# Patient Record
Sex: Male | Born: 1970 | Race: White | Hispanic: No | Marital: Married | Smoking: Never smoker
Health system: Southern US, Community
[De-identification: ages and names within clinical notes are randomized; demographics above are authoritative.]

---

## 2008-02-09 ENCOUNTER — Encounter (INDEPENDENT_AMBULATORY_CARE_PROVIDER_SITE_OTHER): Payer: Self-pay | Admitting: Surgery

## 2008-02-09 ENCOUNTER — Ambulatory Visit (HOSPITAL_COMMUNITY): Admission: RE | Admit: 2008-02-09 | Discharge: 2008-02-09 | Payer: Self-pay | Admitting: Surgery

## 2008-10-03 IMAGING — CT CT ABD-PELV W/O CM
2 of 4 series · 14 of 42 positions shown, 19 images · non-contrast
Comparison: NONE

CLINICAL DATA: Bells, Emi Occasional epigastric pain, right 
lower  quadrant tenderness. Evaluate for appendicitis. 

CT ABDOMEN AND PELVIS WITHOUT INTRAVENOUS OR ORAL CONTRAST
TECHNIQUE: Multiple axial 3.0-mm-thick slices at 3.0-mm 
increments were obtained from the lung base through the pelvis.

[Series 2: wo · axial · 0.79mm/px · z∈[+67,+430]mm · 11 of 145 slices shown, 16 images]
[im 12/145  soft-tissue]
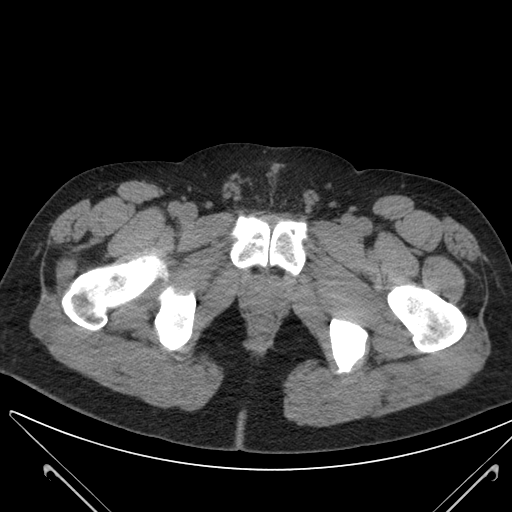
[im 12/145  bone]
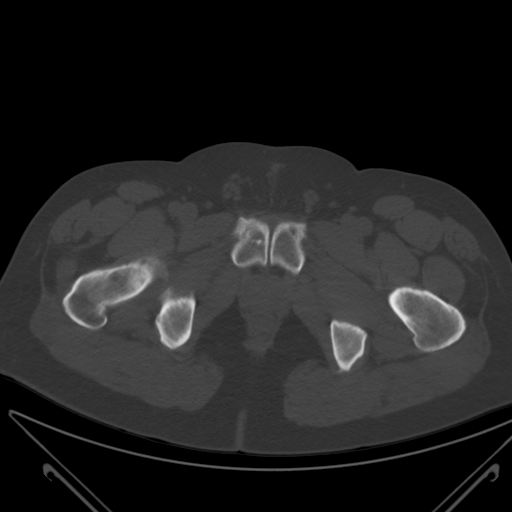
[im 23/145  soft-tissue]
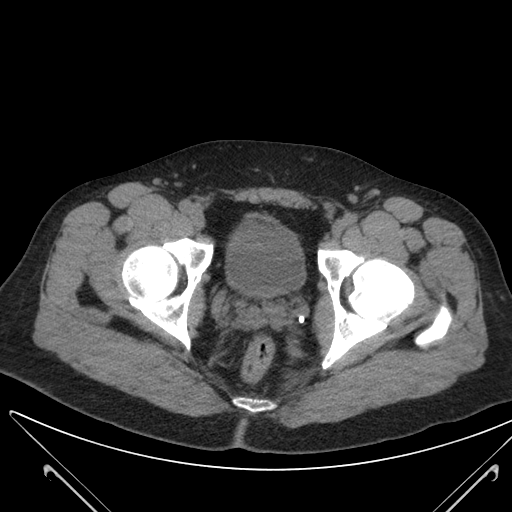
[im 45/145  soft-tissue]
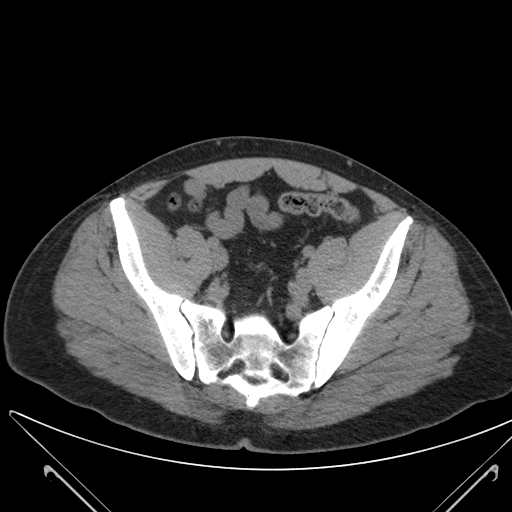
[im 56/145  soft-tissue]
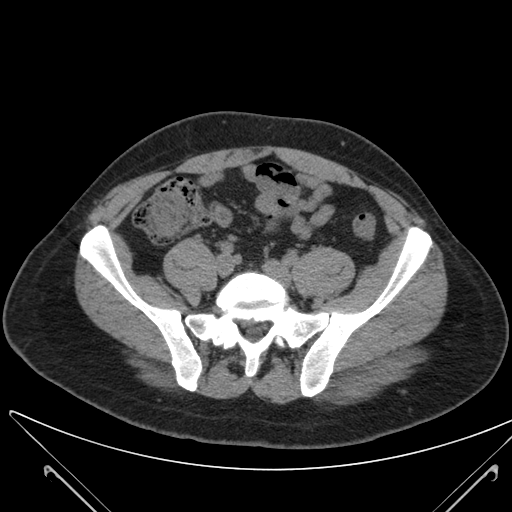
[im 67/145  soft-tissue]
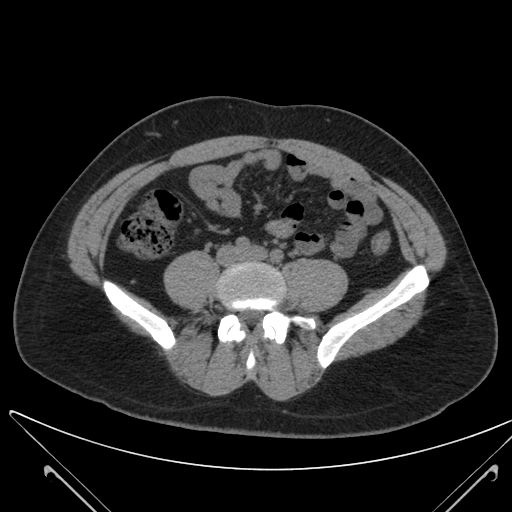
[im 78/145  soft-tissue]
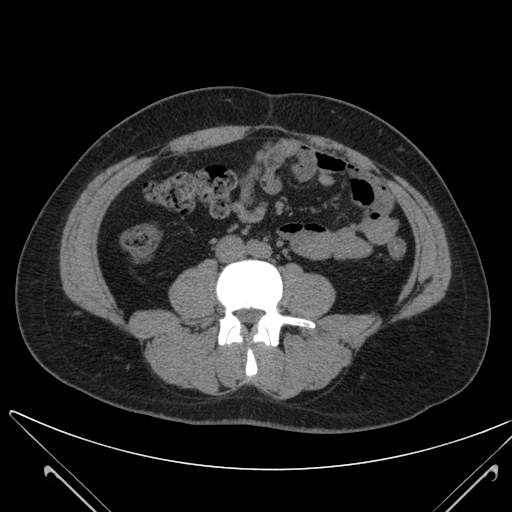
[im 89/145  soft-tissue]
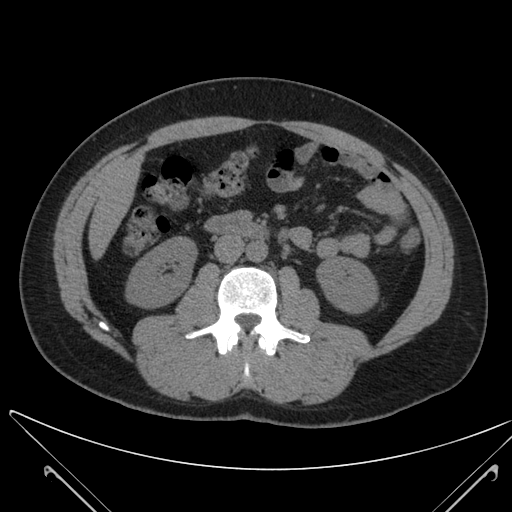
[im 100/145  lung]
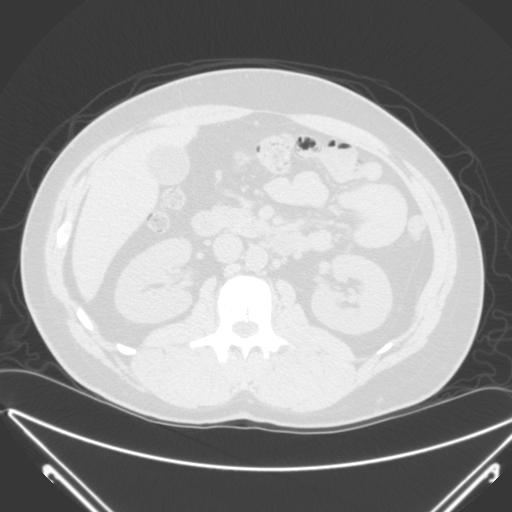
[im 111/145  soft-tissue]
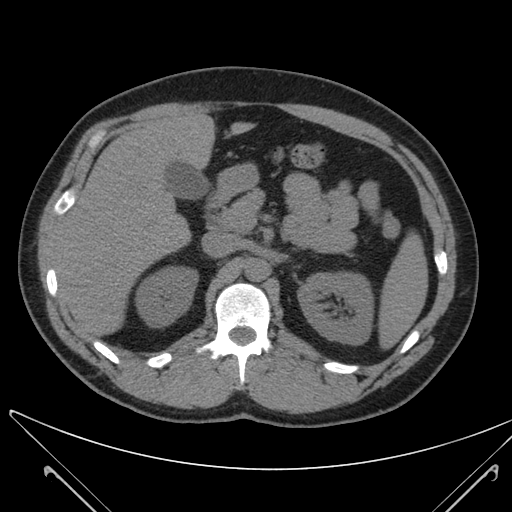
[im 111/145  lung]
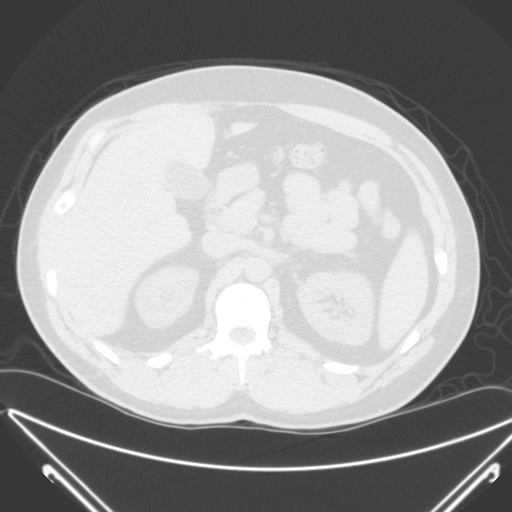
[im 122/145  soft-tissue]
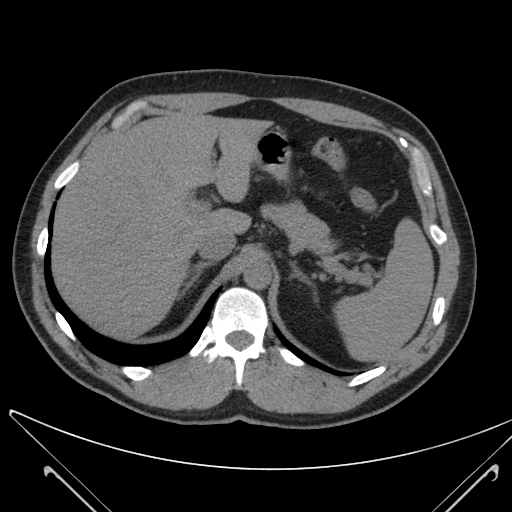
[im 122/145  lung]
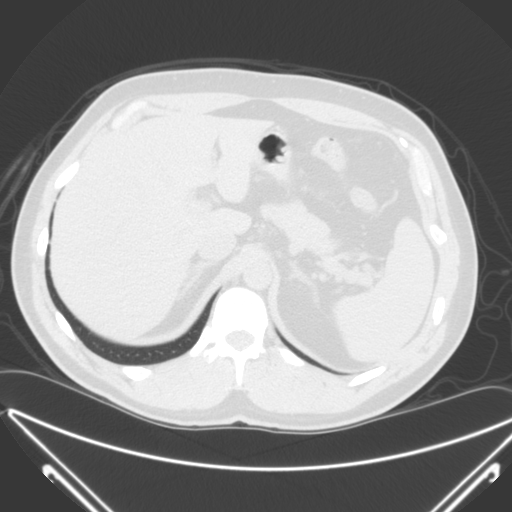
[im 122/145  bone]
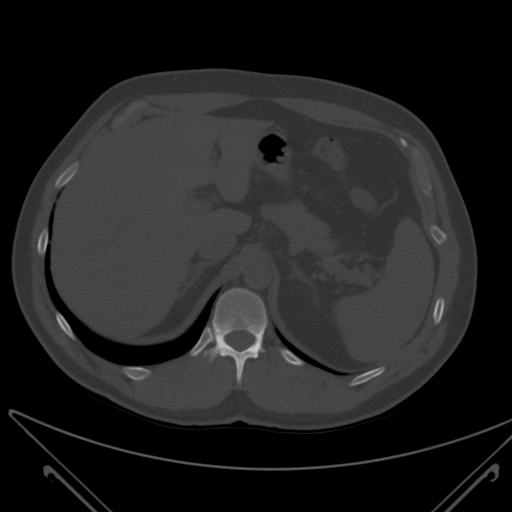
[im 133/145  soft-tissue]
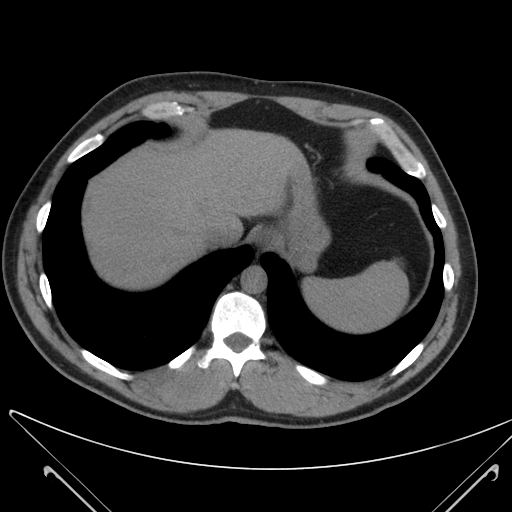
[im 133/145  lung]
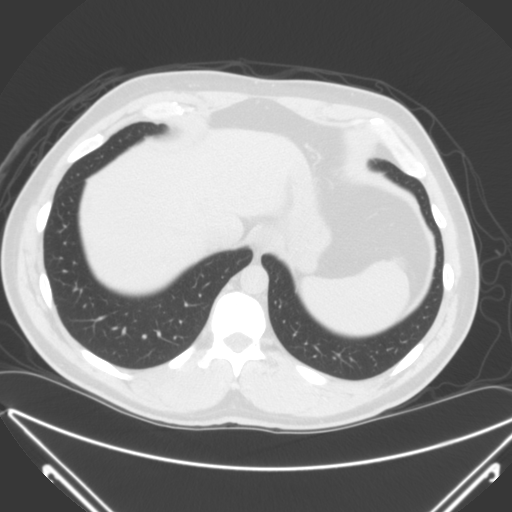

[coronals · coronal · 0.84mm/px · 3 of 80 slices shown]
[im 27/80  soft-tissue]
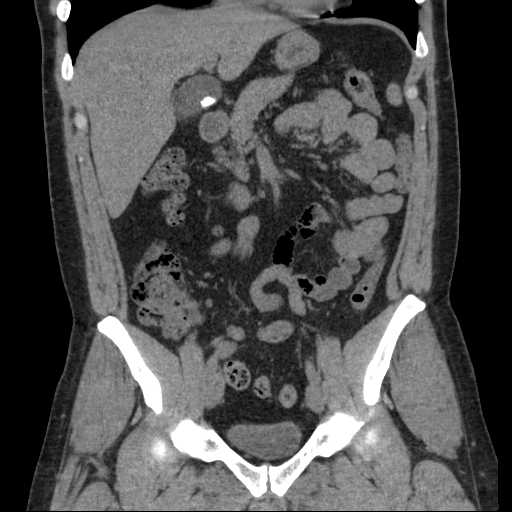
[im 36/80  soft-tissue]
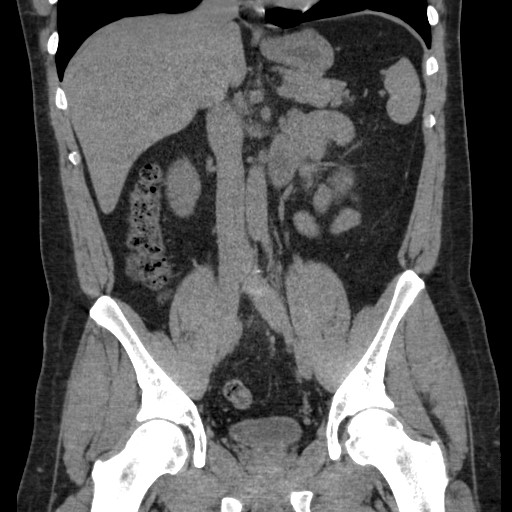
[im 44/80  soft-tissue]
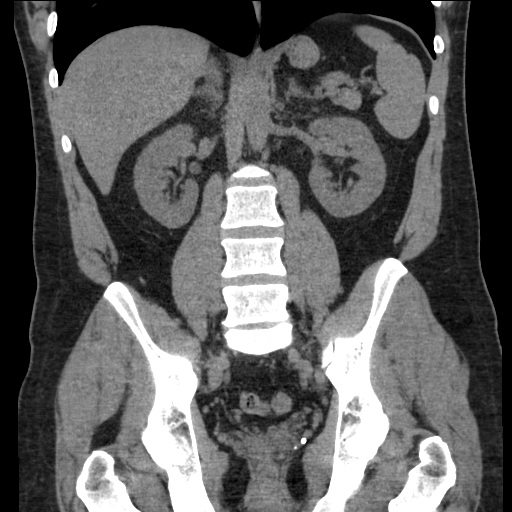

[14 of 42 positions shown; findings below may reference images not displayed]

FINDINGS: Multiple gallstones are present, varying in size from 
10-12 mm to as small as 1-2 mm.  No pericholecystic fluid or 
gallbladder wall thickening.  Faint prostatic calculi are noted.  
No renal or ureteral calculi are identified. No evidence of 
appendicitis, diverticulitis, hernia, or bowel obstruction. No 
lung base mass, infiltrate, edema, or effusion. No lytic or 
blastic lesions are identified.
IMPRESSION: Gallstones. No evidence of obstructive uropathy, 
electronically reviewed on 12/07/2007 Dict Date: 12/07/2007  Tran 
Date:  12/07/2007 DAS  JLM

## 2008-12-06 IMAGING — RF DG CHOLANGIOGRAM OPERATIVE
1 series · 9 of 9 positions shown · non-contrast
Comparison: None available.

CLINICAL DATA: Cholecystitis.

INTRAOPERATIVE CHOLANGIOGRAM
TECHNIQUE: Multiple fluoroscopic spot radiographs were obtained
during intraoperative cholangiogram and are submitted for
interpretation post-operatively.

[Series 1: run · 3 acquisitions, 9 frames shown]
[im 1/3]
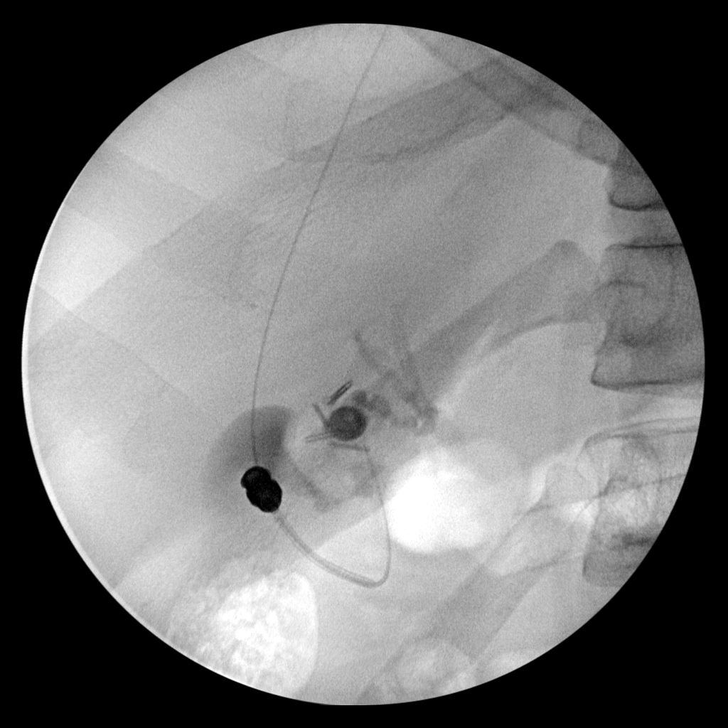
[im 1/3]
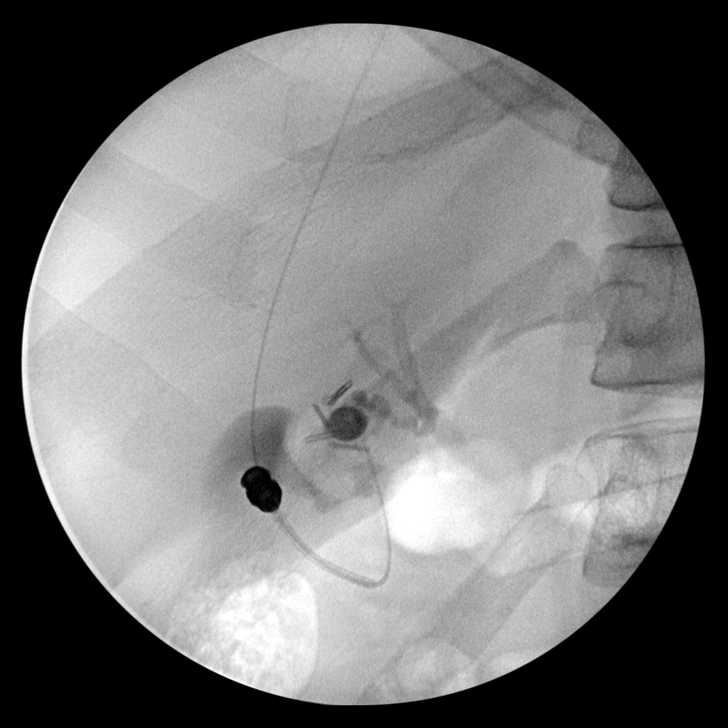
[im 1/3]
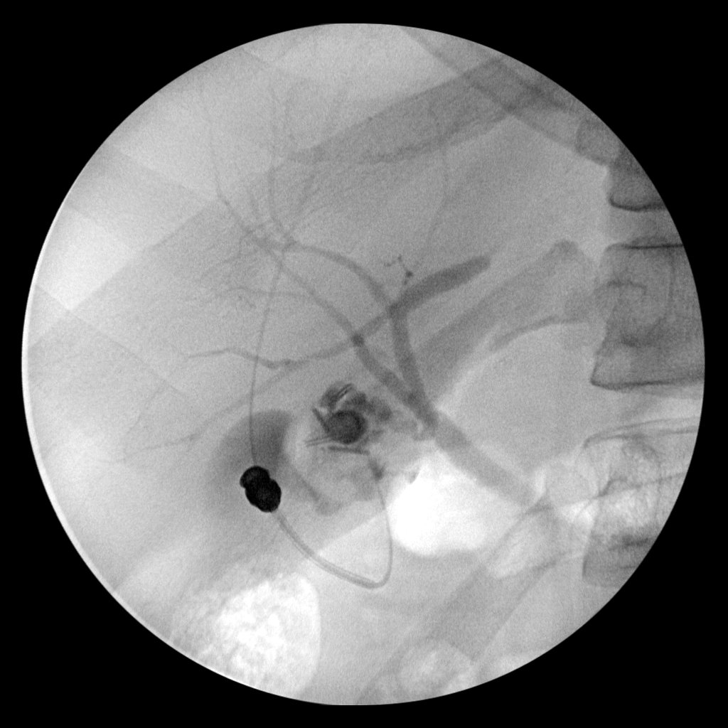
[im 1/3]
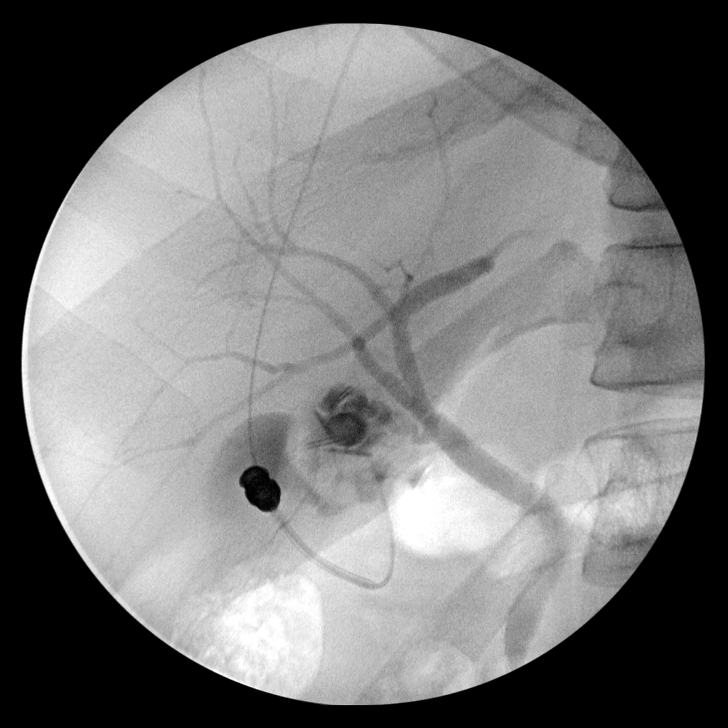
[im 2/3]
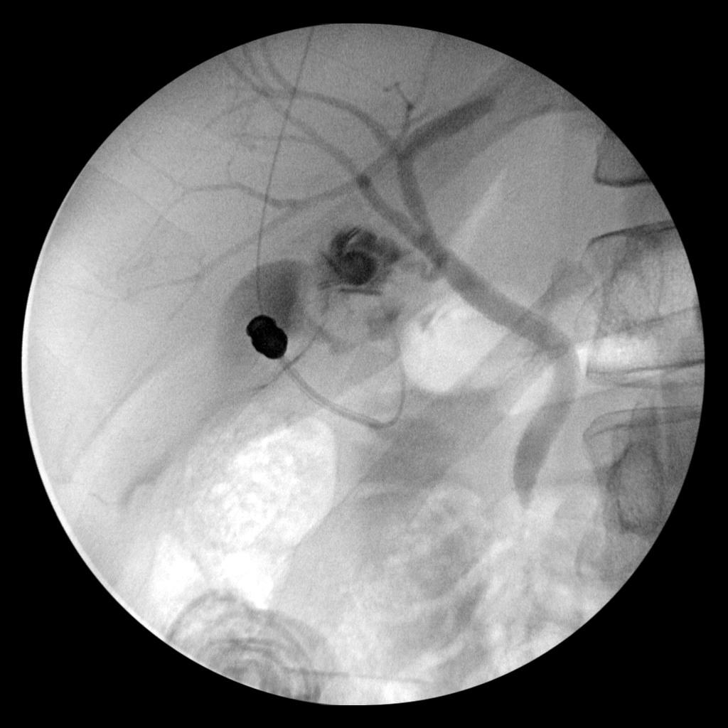
[im 2/3]
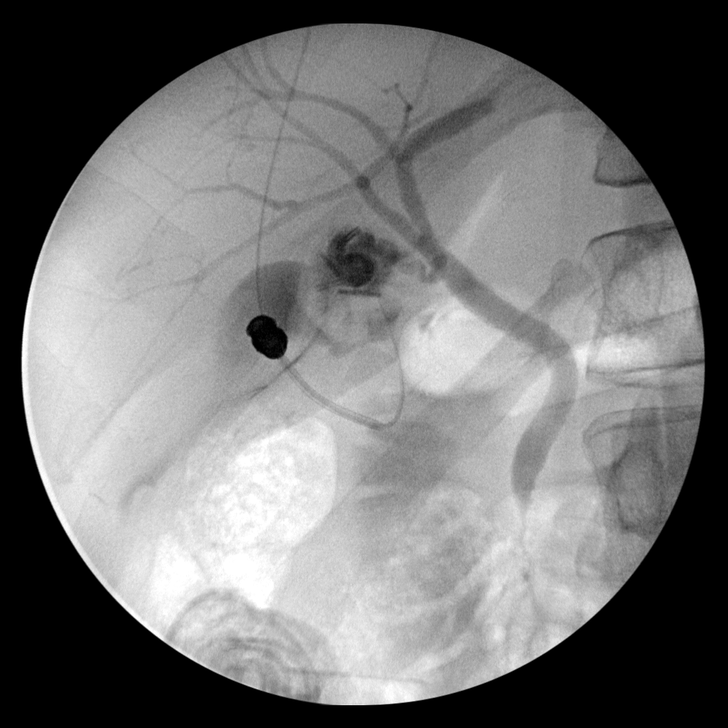
[im 2/3]
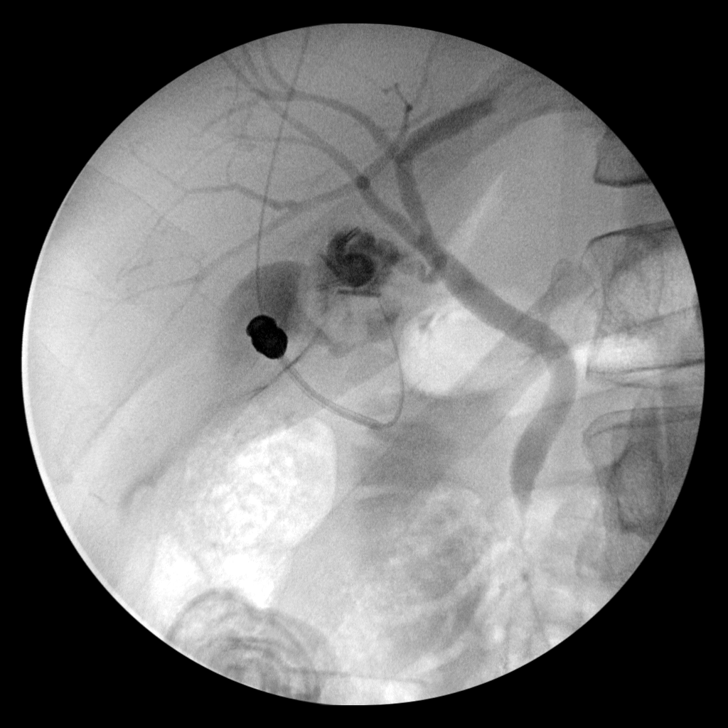
[im 2/3]
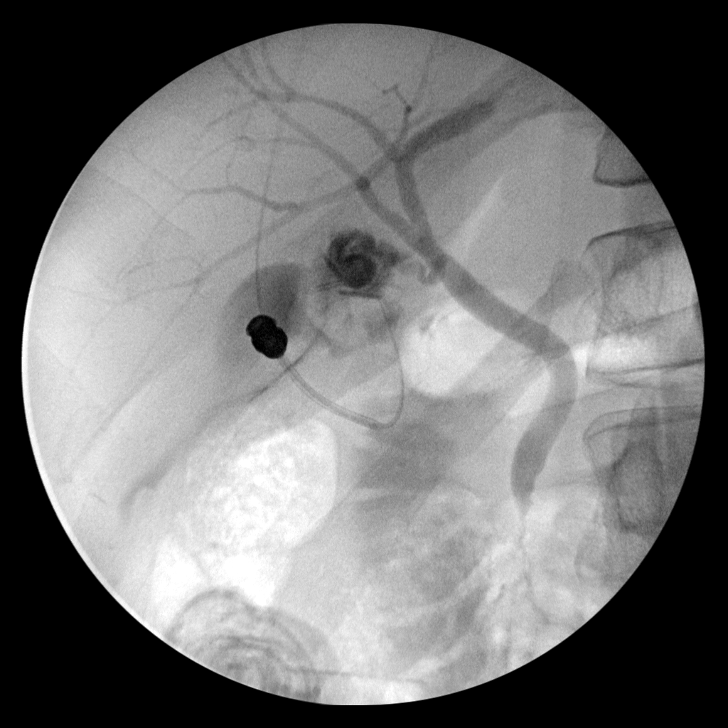
[im 3/3]
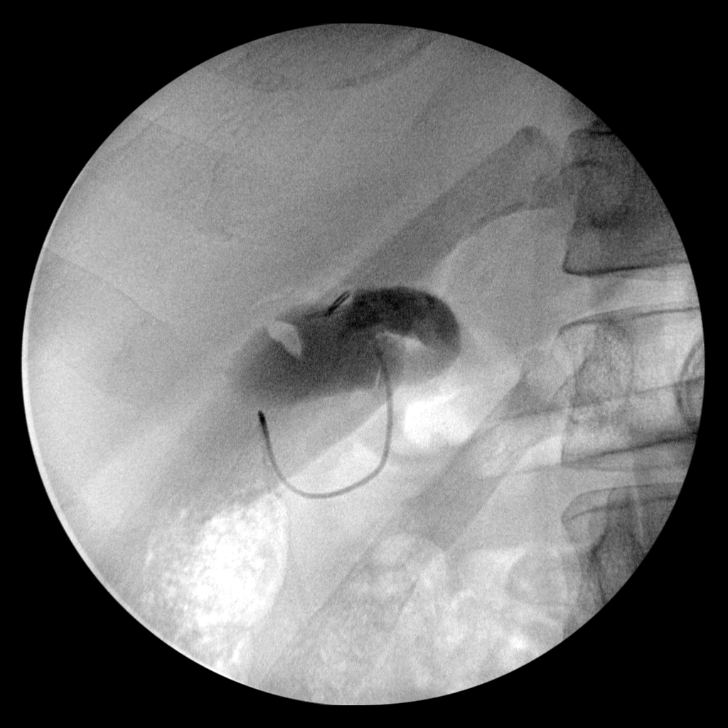

[9 of 9 positions shown; findings below may reference images not displayed]

FINDINGS: We are provided with a series of fluoroscopic
intraoperative spot views from cholangiogram in connection with
cholecystectomy.  Cystic duct is cannulated.  There is some spill
of contrast material at the injection site which is incidentally
noted.  The common bile duct and visualized hepatic ducts all
appear normal without filling defect, dilatation or stricture.
Contrast flows into small bowel.
IMPRESSION: Intraoperative cholangiogram without complicating features.

## 2011-02-03 NOTE — Op Note (Signed)
NAMEADEWALE, PUCILLO           ACCOUNT NO.:  [id number]   MEDICAL RECORD NO.:  [id number]          PATIENT TYPE:  AMB   LOCATION:  DAY                          FACILITY:  [facility]   PHYSICIAN:  Wilmon Arms. Corliss Skains, M.D. DATE OF BIRTH:  December 23, 1970   DATE OF PROCEDURE:  02/09/2008  DATE OF DISCHARGE:                               OPERATIVE REPORT   PREOPERATIVE DIAGNOSIS:  Chronic calculus cholecystitis.   POSTOPERATIVE DIAGNOSIS:  Chronic calculus cholecystitis.   PROCEDURE PERFORMED:  Laparoscopic cholecystectomy with intraoperative  cholangiogram.   SURGEON:  Wilmon Arms. Corliss Skains, M.D.   ASSISTANT:  Anselm Pancoast. Zachery Dakins, M.D.   ANESTHESIA:  General endotracheal.   INDICATIONS:  The patient is a 40 year old male who presents with a 4-  year history of intermittent epigastric right upper quadrant pain.  This  is associated with diarrhea.  He had one severe episode resulting in  nausea and vomiting.  The CT scan showed no sign of appendicitis or  diverticulitis.  However, he did have a lot of gallstones in his  gallbladder.  The patient now presents for elective cholecystectomy.   DESCRIPTION OF PROCEDURE:  The patient was brought to the operating and  placed in the supine position on the operating room table.  After an  adequate level of general anesthesia was obtained, the patient's abdomen  was shaved, prepped with Betadine and draped in a sterile fashion.  A  time-out was taken to assure the proper patient and proper procedure.  The area below his umbilicus was infiltrated with quarter-percent  Marcaine with epinephrine.  A transverse incision was made below the  umbilicus.  Dissection was carried down to the fascia which was opened  vertically.  The peritoneal cavity was bluntly entered.  A stay sutures  of 0 Vicryl was placed around the fascial opening.  The Hasson cannula  was inserted and secured with the stay suture.  Pneumoperitoneum was  obtained by insufflating CO2,  maintaining maximal pressure of 15 mmHg.  The laparoscope was inserted, and the patient was positioned in reversed  Trendelenburg, tilted to his left.  An 11-mm port was placed into the  subxiphoid position.  Two 5-mm ports were placed in the right upper  quadrant.  The gallbladder was grasped with a clamp and elevated over  the edge of the liver.  The gallbladder did not appear grossly inflamed.  It was small and fairly intrahepatic in location.  There were some  adhesions around the hilum of the gallbladder.  These were taken down  with cautery and blunt dissection.  We exposed the neck of the  gallbladder.  The peritoneum was opened, and we bluntly dissected around  the cystic duct.  We also identified the cystic artery.  The cystic  artery was ligated with clips and divided.  The cystic duct was fairly  large in size.  We placed a clip across the cystic duct and opened it  proximal to the clip.  We brought a Cook cholangiogram catheter through  a stab incision and threaded it in the cystic duct.  There seemed to be  an obstruction in the cystic  duct.  We irrigated this, and I tried to  milk out a stone.  The catheter was inserted and secured with a clip.  A  cholangiogram was obtained which showed obstruction of the cystic duct  and the contrast extravasated.  We removed the catheter and continued  dissecting down around the cystic duct.  We were able to milk back a  couple small stones.  We were able to slide a catheter past the cystic  duct stone.  The cystic duct was then further examined on a repeat  cholangiogram.  It was a long tortuous cystic duct.  The common bile  duct appeared clear.  Contrast flowed easily into the duodenum.  The  catheter was removed, and we continued dissecting down along the side of  the cystic duct.  We made another opening on the cystic duct and we were  able to milk back some stones.  The cystic duct was ligated with clips  and divided.  Cautery was  then used to remove the gallbladder from the  liver bed.  The gallbladder was placed in an EndoCatch sac and removed  through the umbilical port site.  The gallbladder fossa was thoroughly  cauterized for hemostasis.  We suctioned out as much irrigation as  possible.  The pneumoperitoneum was then released as the trocars were  removed under direct vision.  Stay sutures were used to close the  umbilical fascia.  4-0 Monocryl was used to close skin incisions.  Steri-  Strips and clean dressings were applied.  The patient was then extubated  and brought to the recovery in stable condition.  All sponge, instrument  and needle counts were correct.      Wilmon Arms. Tsuei, M.D.  Electronically Signed     MKT/MEDQ  D:  02/09/2008  T:  02/09/2008  Job:  [id number]   cc:   Pam Drown, M.D.  Fax: [phone]

## 2011-06-17 LAB — CBC
HCT: 46.5
MCHC: 33.8
MCV: 85.5
Platelets: 248

## 2011-06-17 LAB — COMPREHENSIVE METABOLIC PANEL
AST: 26
Albumin: 4
BUN: 9
CO2: 26
Calcium: 9.2
Creatinine, Ser: 0.83
GFR calc Af Amer: >60
GFR calc non Af Amer: >60

## 2011-06-17 LAB — DIFFERENTIAL
Eosinophils Relative: 2
Lymphocytes Relative: 18
Lymphs Abs: 1.6
Neutro Abs: 5.9

## 2015-11-08 ENCOUNTER — Telehealth: Payer: Self-pay | Admitting: *Deleted
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
## 2015-11-08 NOTE — Telephone Encounter (Signed)
Pt has a question about his bill from 02/05/15. Would like a return call from [place].

## 2019-07-10 ENCOUNTER — Other Ambulatory Visit: Payer: Self-pay

## 2019-07-10 DIAGNOSIS — Z20822 Contact with and (suspected) exposure to covid-19: Secondary | ICD-10-CM

## 2019-07-12 LAB — NOVEL CORONAVIRUS, NAA: SARS-CoV-2, NAA: NOT DETECTED

## 2023-05-03 ENCOUNTER — Encounter (HOSPITAL_BASED_OUTPATIENT_CLINIC_OR_DEPARTMENT_OTHER): Payer: Self-pay | Admitting: Emergency Medicine

## 2023-05-03 ENCOUNTER — Emergency Department (HOSPITAL_BASED_OUTPATIENT_CLINIC_OR_DEPARTMENT_OTHER): Payer: No Typology Code available for payment source

## 2023-05-03 ENCOUNTER — Other Ambulatory Visit: Payer: Self-pay

## 2023-05-03 ENCOUNTER — Emergency Department (HOSPITAL_BASED_OUTPATIENT_CLINIC_OR_DEPARTMENT_OTHER)
Admission: EM | Admit: 2023-05-03 | Discharge: 2023-05-03 | Disposition: A | Discharge status: Home / Self Care | Payer: No Typology Code available for payment source | Attending: Emergency Medicine | Admitting: Emergency Medicine

## 2023-05-03 DIAGNOSIS — Z23 Encounter for immunization: Secondary | ICD-10-CM | POA: Insufficient documentation

## 2023-05-03 DIAGNOSIS — S81811A Laceration without foreign body, right lower leg, initial encounter: Secondary | ICD-10-CM | POA: Diagnosis present

## 2023-05-03 DIAGNOSIS — W208XXA Other cause of strike by thrown, projected or falling object, initial encounter: Secondary | ICD-10-CM | POA: Diagnosis not present

## 2023-05-03 MED ORDER — LIDOCAINE-EPINEPHRINE-TETRACAINE (LET) TOPICAL GEL
3.0000 mL | Freq: Once | TOPICAL | Status: AC
  Administered 2023-05-03: 3 mL via TOPICAL
  Filled 2023-05-03: qty 3

## 2023-05-03 MED ORDER — OXYCODONE-ACETAMINOPHEN 5-325 MG PO TABS
1.0000 | ORAL_TABLET | Freq: Once | ORAL | Status: AC
  Administered 2023-05-03: 1 via ORAL
  Filled 2023-05-03: qty 1

## 2023-05-03 MED ORDER — LIDOCAINE-EPINEPHRINE (PF) 2 %-1:200000 IJ SOLN
10.0000 mL | Freq: Once | INTRAMUSCULAR | Status: AC
  Administered 2023-05-03: 10 mL via INTRADERMAL
  Filled 2023-05-03: qty 20

## 2023-05-03 MED ORDER — CEPHALEXIN 500 MG PO CAPS: 500.0000 mg | ORAL_CAPSULE | Freq: Four times a day (QID) | ORAL | 0 refills | Status: AC

## 2023-05-03 MED ORDER — TETANUS-DIPHTH-ACELL PERTUSSIS 5-2.5-18.5 LF-MCG/0.5 IM SUSY
0.5000 mL | PREFILLED_SYRINGE | Freq: Once | INTRAMUSCULAR | Status: AC
  Administered 2023-05-03: 0.5 mL via INTRAMUSCULAR
  Filled 2023-05-03: qty 0.5
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
## 2023-05-03 NOTE — Discharge Instructions (Addendum)
You were seen in the emergency department today for a laceration to your right lower leg.  This was repaired with nonabsorbable sutures.  These need to remain in place for the next 7 to 10 days before removal.  If you have any signs of infection that develop, please return the emergency department.  Have sent a prescription for an antibiotic to be used to reduce the risk of infection.
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
